# Patient Record
Sex: Female | Born: 1990 | Race: White | Hispanic: No | Marital: Single | Smoking: Never smoker
Health system: Southern US, Community
[De-identification: ages and names within clinical notes are randomized; demographics above are authoritative.]

## PROBLEM LIST (undated history)

## (undated) DIAGNOSIS — F3181 Bipolar II disorder: Secondary | ICD-10-CM

## (undated) DIAGNOSIS — F419 Anxiety disorder, unspecified: Secondary | ICD-10-CM

---
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
## 2018-12-08 ENCOUNTER — Emergency Department: Payer: BLUE CROSS/BLUE SHIELD

## 2018-12-08 ENCOUNTER — Emergency Department
Admission: EM | Admit: 2018-12-08 | Discharge: 2018-12-08 | Disposition: A | Discharge status: Home / Self Care | Payer: BLUE CROSS/BLUE SHIELD | Attending: Emergency Medicine | Admitting: Emergency Medicine

## 2018-12-08 ENCOUNTER — Other Ambulatory Visit: Payer: Self-pay

## 2018-12-08 ENCOUNTER — Encounter: Payer: Self-pay | Admitting: Emergency Medicine

## 2018-12-08 DIAGNOSIS — R109 Unspecified abdominal pain: Secondary | ICD-10-CM | POA: Diagnosis present

## 2018-12-08 DIAGNOSIS — R1011 Right upper quadrant pain: Secondary | ICD-10-CM | POA: Insufficient documentation

## 2018-12-08 DIAGNOSIS — R1013 Epigastric pain: Secondary | ICD-10-CM

## 2018-12-08 DIAGNOSIS — R110 Nausea: Secondary | ICD-10-CM | POA: Diagnosis not present

## 2018-12-08 HISTORY — DX: Anxiety disorder, unspecified: F41.9

## 2018-12-08 HISTORY — DX: Bipolar II disorder: F31.81

## 2018-12-08 LAB — COMPREHENSIVE METABOLIC PANEL
ALT: 17 U/L (ref 0–44)
AST: 15 U/L (ref 15–41)
Albumin: 3.7 g/dL (ref 3.5–5.0)
Alkaline Phosphatase: 55 U/L (ref 38–126)
Anion gap: 8 (ref 5–15)
BUN: 15 mg/dL (ref 6–20)
CO2: 27 mmol/L (ref 22–32)
Calcium: 8.9 mg/dL (ref 8.9–10.3)
Chloride: 106 mmol/L (ref 98–111)
Creatinine, Ser: 0.89 mg/dL (ref 0.44–1.00)
GFR calc Af Amer: >60 mL/min (ref >60)
GFR calc non Af Amer: >60 mL/min (ref >60)
Glucose, Bld: 107 mg/dL — ABNORMAL HIGH (ref 70–99)
Potassium: 3.9 mmol/L (ref 3.5–5.1)
Sodium: 141 mmol/L (ref 135–145)
Total Bilirubin: 0.5 mg/dL (ref 0.3–1.2)
Total Protein: 7 g/dL (ref 6.5–8.1)

## 2018-12-08 LAB — URINALYSIS, COMPLETE (UACMP) WITH MICROSCOPIC
Bacteria, UA: NONE SEEN
Bilirubin Urine: NEGATIVE
Glucose, UA: NEGATIVE mg/dL
Ketones, ur: NEGATIVE mg/dL
Nitrite: NEGATIVE
Protein, ur: NEGATIVE mg/dL
Specific Gravity, Urine: 1.029 (ref 1.005–1.030)
pH: 5 (ref 5.0–8.0)

## 2018-12-08 LAB — CBC
HCT: 45.9 % (ref 36.0–46.0)
Hemoglobin: 15.5 g/dL — ABNORMAL HIGH (ref 12.0–15.0)
MCH: 30.6 pg (ref 26.0–34.0)
MCHC: 33.8 g/dL (ref 30.0–36.0)
MCV: 90.7 fL (ref 80.0–100.0)
Platelets: 267 10*3/uL (ref 150–400)
RBC: 5.06 MIL/uL (ref 3.87–5.11)
RDW: 12.6 % (ref 11.5–15.5)
WBC: 13.3 10*3/uL — ABNORMAL HIGH (ref 4.0–10.5)
nRBC: 0 % (ref 0.0–0.2)

## 2018-12-08 LAB — LIPASE, BLOOD: Lipase: 42 U/L (ref 11–51)

## 2018-12-08 LAB — POCT PREGNANCY, URINE: Preg Test, Ur: NEGATIVE

## 2018-12-08 MED ORDER — PANTOPRAZOLE SODIUM 40 MG PO TBEC: 40.0000 mg | DELAYED_RELEASE_TABLET | Freq: Every day | ORAL | 1 refills | Status: AC

## 2018-12-08 MED ORDER — FAMOTIDINE IN NACL 20-0.9 MG/50ML-% IV SOLN
20.0000 mg | Freq: Once | INTRAVENOUS | Status: AC
  Administered 2018-12-08: 20 mg via INTRAVENOUS
  Filled 2018-12-08: qty 50

## 2018-12-08 MED ORDER — LIDOCAINE VISCOUS HCL 2 % MT SOLN
15.0000 mL | Freq: Once | OROMUCOSAL | Status: AC
  Administered 2018-12-08: 15 mL via ORAL
  Filled 2018-12-08: qty 15

## 2018-12-08 MED ORDER — ALUM & MAG HYDROXIDE-SIMETH 200-200-20 MG/5ML PO SUSP
30.0000 mL | Freq: Once | ORAL | Status: AC
  Administered 2018-12-08: 30 mL via ORAL
  Filled 2018-12-08: qty 30

## 2018-12-08 MED ORDER — METOCLOPRAMIDE HCL 5 MG/ML IJ SOLN
10.0000 mg | Freq: Once | INTRAMUSCULAR | Status: AC
  Administered 2018-12-08: 10 mg via INTRAVENOUS
  Filled 2018-12-08: qty 2

## 2018-12-08 MED ORDER — ONDANSETRON 4 MG PO TBDP: 4.0000 mg | ORAL_TABLET | Freq: Three times a day (TID) | ORAL | 0 refills | Status: AC | PRN

## 2018-12-08 NOTE — ED Notes (Signed)
Wells Guiles, primary Rn notified of pt arrival in room. Call bell at left side, pt instructed to change into gown, bed adjusted for comfort.

## 2018-12-08 NOTE — ED Triage Notes (Signed)
Pt reports abd pain, n/v/d that started yesterday.

## 2018-12-08 NOTE — ED Notes (Signed)
IV attempted x 2 by this RN. 

## 2018-12-08 NOTE — ED Notes (Signed)
Pt to xray at this time.

## 2018-12-08 NOTE — Discharge Instructions (Addendum)

## 2018-12-08 NOTE — ED Provider Notes (Signed)
[facility] Emergency Department Provider Note  ____________________________________________  Time seen: Approximately 2:18 AM  I have reviewed the triage vital signs and the nursing notes.   HISTORY  Chief Complaint Abdominal Pain   HPI Tara Holt is a 28 y.o. female with history of anxiety and bipolar disorder presents for evaluation of abdominal pain.  Patient reports the pain started 2 days ago.  She has a constant throbbing pain located in the epigastric region associated with an intermittent sharp stabbing pain.  The throbbing pain is 5 out of 10, the sharp pain is 10 out of 10.  Pain is located in her epigastric region and nonradiating.  She was unable to sleep this evening due to pain.  She has had nausea however this is chronic for her.  She reports having several months of nausea.  She reports having one episode of diarrhea last week.  No diarrhea at this time.  Has had normal bowel movements, passing flatus, no prior abdominal surgeries.  No dysuria or hematuria, patient is not sexually active, LMP a week ago, no chest pain or shortness of breath, no fever, no vomiting.   No NSAID usage, no alcohol usage, no drug use.  Past Medical History:  Diagnosis Date  . Anxiety   . Bipolar 2 disorder (HCC)     Allergies Sulfa antibiotics and Relafen [nabumetone]  History reviewed. No pertinent family history.  Social History Social History   Tobacco Use  . Smoking status: Never Smoker  . Smokeless tobacco: Never Used  Substance Use Topics  . Alcohol use: Never    Frequency: Never  . Drug use: Never    Review of Systems  Constitutional: Negative for fever. Eyes: Negative for visual changes. ENT: Negative for sore throat. Neck: No neck pain  Cardiovascular: Negative for chest pain. Respiratory: Negative for shortness of breath. Gastrointestinal: + abdominal pain and nausea. No vomiting or diarrhea. Genitourinary: Negative for dysuria.  Musculoskeletal: Negative for back pain. Skin: Negative for rash. Neurological: Negative for headaches, weakness or numbness. Psych: No SI or HI  ____________________________________________   PHYSICAL EXAM:  VITAL SIGNS: ED Triage Vitals  Enc Vitals Group     BP 12/08/18 0057 129/90     Pulse Rate 12/08/18 0057 86     Resp 12/08/18 0057 18     Temp 12/08/18 0057 98.9 F (37.2 C)     Temp Source 12/08/18 0057 Oral     SpO2 12/08/18 0057 99 %     Weight 12/08/18 0052 (!) 305 lb (138.3 kg)     Height 12/08/18 0052 5\' 3"  (1.6 m)     Head Circumference --      Peak Flow --      Pain Score 12/08/18 0051 5     Pain Loc --      Pain Edu? --      Excl. in [place]? --     Constitutional: Alert and oriented. Well appearing and in no apparent distress. HEENT:      Head: Normocephalic and atraumatic.         Eyes: Conjunctivae are normal. Sclera is non-icteric.       Mouth/Throat: Mucous membranes are moist.       Neck: Supple with no signs of meningismus. Cardiovascular: Regular rate and rhythm. No murmurs, gallops, or rubs. 2+ symmetrical distal pulses are present in all extremities. No JVD. Respiratory: Normal respiratory effort. Lungs are clear to auscultation bilaterally. No wheezes, crackles, or rhonchi.  Gastrointestinal: Soft, mild epigastric tenderness, and non distended with positive bowel sounds. No rebound or guarding. Genitourinary: No CVA tenderness. Musculoskeletal: Nontender with normal range of motion in all extremities. No edema, cyanosis, or erythema of extremities. Neurologic: Normal speech and language. Face is symmetric. Moving all extremities. No gross focal neurologic deficits are appreciated. Skin: Skin is warm, dry and intact. No rash noted. Psychiatric: Mood and affect are normal. Speech and behavior are normal.  ____________________________________________   LABS (all labs ordered are listed, but only abnormal results are displayed)  Labs Reviewed   COMPREHENSIVE METABOLIC PANEL - Abnormal; Notable for the following components:      Result Value   Glucose, Bld 107 (*)    All other components within normal limits  CBC - Abnormal; Notable for the following components:   WBC 13.3 (*)    Hemoglobin 15.5 (*)    All other components within normal limits  URINALYSIS, COMPLETE (UACMP) WITH MICROSCOPIC - Abnormal; Notable for the following components:   Color, Urine YELLOW (*)    APPearance CLOUDY (*)    Hgb urine dipstick SMALL (*)    Leukocytes,Ua SMALL (*)    All other components within normal limits  LIPASE, BLOOD  POC URINE PREG, ED  POCT PREGNANCY, URINE   ____________________________________________  EKG  none  ____________________________________________  RADIOLOGY  I have personally reviewed the images performed during this visit and I agree with the Radiologist's read.   Interpretation by Radiologist:  Dg Abd 2 Views  Result Date: 12/08/2018 CLINICAL DATA:  28 year old female with abdominal pain nausea and vomiting since yesterday. EXAM: ABDOMEN - 2 VIEW COMPARISON:  None. FINDINGS: Upright and supine views. Negative lung bases. No pneumoperitoneum. Non obstructed bowel gas pattern. Incidental IUD in the central pelvis. Abdominal and pelvic visceral contours are normal. Mild scoliosis. No acute osseous abnormality identified. IMPRESSION: Normal bowel gas pattern, no free air. Electronically Signed   By: Odessa FlemingH  Hall M.D.   On: 12/08/2018 02:27   [place] Abdomen Limited Ruq  Result Date: 12/08/2018 CLINICAL DATA:  Abdominal pain EXAM: ULTRASOUND ABDOMEN LIMITED RIGHT UPPER QUADRANT COMPARISON:  None. FINDINGS: Gallbladder: No gallstones or wall thickening visualized. No sonographic Murphy sign noted by sonographer. Common bile duct: Diameter: 2.7 mm Liver: Diffuse increased echogenicity is noted without focal mass. Portal vein is patent on color Doppler imaging with normal direction of blood flow towards the liver. Other: None.  IMPRESSION: Fatty liver. Electronically Signed   By: Alcide CleverMark  Lukens M.D.   On: 12/08/2018 02:48      ____________________________________________   PROCEDURES  Procedure(s) performed: None Procedures Critical Care performed:  None ____________________________________________   INITIAL IMPRESSION / ASSESSMENT AND PLAN / ED COURSE  28 y.o. female with history of anxiety and bipolar disorder presents for evaluation of 2 days of throbbing epigastric abdominal pain associated with several months of nausea.  Patient is well-appearing in no distress with normal vitals, abdomen is soft with epigastric tenderness, no rebound or guarding, no lower abdominal tenderness.  Labs showing leukocytosis with white count of 13.3, although some of it seems to be hemoconcentration since patient's hemoglobin is 15.5.  Normal LFTs and lipase, UA negative for UTI, pregnancy test is negative.    Ddx gastritis versus peptic ulcer disease versus pancreatitis versus gallbladder disease.  Low suspicion for appendicitis or ovarian pathology with no tenderness in the lower abdominal region.  Will give IV Reglan and Pepcid, will send patient for right upper quadrant ultrasound.  Will get a KUB  Clinical Course as of Dec 08 503  Mon Dec 08, 2018  3736 Right upper quadrant ultrasound and KUB with no significant findings.  Patient's pain resolved with Reglan, Pepcid, GI cocktail.  Possibly gastritis versus peptic ulcer disease.  Will refer to GI.  Will discharge home on Protonix and Zofran for nausea.  Discussed my standard return precautions and close follow-up with PCP.   [CV]    Clinical Course User Index [CV] Don Perking [place], MD      As part of my medical decision making, I reviewed the following data within the electronic MEDICAL RECORD NUMBER Nursing notes reviewed and incorporated, Labs reviewed , Old chart reviewed, Radiograph reviewed , Notes from prior ED visits and [place] Controlled Substance Database    Patient was evaluated in Emergency Department today for the symptoms described in the history of present illness. Patient was evaluated in the context of the global COVID-19 pandemic, which necessitated consideration that the patient might be at risk for infection with the SARS-CoV-2 virus that causes COVID-19. Institutional protocols and algorithms that pertain to the evaluation of patients at risk for COVID-19 are in a state of rapid change based on information released by regulatory bodies including the CDC and federal and state organizations. These policies and algorithms were followed during the patient's care in the ED.   ____________________________________________   FINAL CLINICAL IMPRESSION(S) / ED DIAGNOSES   Final diagnoses:  Epigastric abdominal pain  Nausea      NEW MEDICATIONS STARTED DURING THIS VISIT:  ED Discharge Orders         Ordered    pantoprazole (PROTONIX) 40 MG tablet  Daily     12/08/18 0505    ondansetron (ZOFRAN ODT) 4 MG disintegrating tablet  Every 8 hours PRN     12/08/18 0505           Note:  This document was prepared using Dragon voice recognition software and may include unintentional dictation errors.    Don Perking, [place], MD 12/08/18 [phone]

## 2019-05-03 ENCOUNTER — Emergency Department
Admission: EM | Admit: 2019-05-03 | Discharge: 2019-05-03 | Disposition: A | Discharge status: Home / Self Care | Payer: BLUE CROSS/BLUE SHIELD | Attending: Emergency Medicine | Admitting: Emergency Medicine

## 2019-05-03 ENCOUNTER — Other Ambulatory Visit: Payer: Self-pay

## 2019-05-03 ENCOUNTER — Encounter: Payer: Self-pay | Admitting: Emergency Medicine

## 2019-05-03 DIAGNOSIS — M25511 Pain in right shoulder: Secondary | ICD-10-CM | POA: Insufficient documentation

## 2019-05-03 DIAGNOSIS — Z79899 Other long term (current) drug therapy: Secondary | ICD-10-CM | POA: Diagnosis not present

## 2019-05-03 DIAGNOSIS — R55 Syncope and collapse: Secondary | ICD-10-CM | POA: Insufficient documentation

## 2019-05-03 DIAGNOSIS — R110 Nausea: Secondary | ICD-10-CM | POA: Diagnosis not present

## 2019-05-03 DIAGNOSIS — R232 Flushing: Secondary | ICD-10-CM | POA: Insufficient documentation

## 2019-05-03 LAB — COMPREHENSIVE METABOLIC PANEL
ALT: 21 U/L (ref 0–44)
AST: 16 U/L (ref 15–41)
Albumin: 3.4 g/dL — ABNORMAL LOW (ref 3.5–5.0)
Alkaline Phosphatase: 64 U/L (ref 38–126)
Anion gap: 9 (ref 5–15)
BUN: 15 mg/dL (ref 6–20)
CO2: 25 mmol/L (ref 22–32)
Calcium: 8.9 mg/dL (ref 8.9–10.3)
Chloride: 105 mmol/L (ref 98–111)
Creatinine, Ser: 0.84 mg/dL (ref 0.44–1.00)
GFR calc Af Amer: >60 mL/min (ref >60)
GFR calc non Af Amer: >60 mL/min (ref >60)
Glucose, Bld: 114 mg/dL — ABNORMAL HIGH (ref 70–99)
Potassium: 4 mmol/L (ref 3.5–5.1)
Sodium: 139 mmol/L (ref 135–145)
Total Bilirubin: 0.3 mg/dL (ref 0.3–1.2)
Total Protein: 6.6 g/dL (ref 6.5–8.1)

## 2019-05-03 LAB — CBC WITH DIFFERENTIAL/PLATELET
Abs Immature Granulocytes: 0.04 10*3/uL (ref 0.00–0.07)
Basophils Absolute: 0.1 10*3/uL (ref 0.0–0.1)
Basophils Relative: 1 %
Eosinophils Absolute: 0.2 10*3/uL (ref 0.0–0.5)
Eosinophils Relative: 2 %
HCT: 44.4 % (ref 36.0–46.0)
Hemoglobin: 14.9 g/dL (ref 12.0–15.0)
Immature Granulocytes: 0 %
Lymphocytes Relative: 31 %
Lymphs Abs: 3.7 10*3/uL (ref 0.7–4.0)
MCH: 30.1 pg (ref 26.0–34.0)
MCHC: 33.6 g/dL (ref 30.0–36.0)
MCV: 89.7 fL (ref 80.0–100.0)
Monocytes Absolute: 1 10*3/uL (ref 0.1–1.0)
Monocytes Relative: 8 %
Neutro Abs: 7 10*3/uL (ref 1.7–7.7)
Neutrophils Relative %: 58 %
Platelets: 275 10*3/uL (ref 150–400)
RBC: 4.95 MIL/uL (ref 3.87–5.11)
RDW: 12.4 % (ref 11.5–15.5)
WBC: 12 10*3/uL — ABNORMAL HIGH (ref 4.0–10.5)
nRBC: 0 % (ref 0.0–0.2)

## 2019-05-03 LAB — URINALYSIS, ROUTINE W REFLEX MICROSCOPIC
Bacteria, UA: NONE SEEN
Bilirubin Urine: NEGATIVE
Glucose, UA: NEGATIVE mg/dL
Ketones, ur: NEGATIVE mg/dL
Leukocytes,Ua: NEGATIVE
Nitrite: NEGATIVE
Protein, ur: 30 mg/dL — AB
Specific Gravity, Urine: 1.023 (ref 1.005–1.030)
pH: 5 (ref 5.0–8.0)

## 2019-05-03 LAB — LIPASE, BLOOD: Lipase: 27 U/L (ref 11–51)

## 2019-05-03 LAB — POCT PREGNANCY, URINE: Preg Test, Ur: NEGATIVE

## 2019-05-03 LAB — MAGNESIUM: Magnesium: 1.9 mg/dL (ref 1.7–2.4)

## 2019-05-03 MED ORDER — SODIUM CHLORIDE 0.9 % IV BOLUS
500.0000 mL | Freq: Once | INTRAVENOUS | Status: AC
  Administered 2019-05-03: 500 mL via INTRAVENOUS

## 2019-05-03 NOTE — Discharge Instructions (Addendum)
You have been seen today in the Emergency Department (ED)  for syncope (passing out).  Your workup including labs and EKG show reassuring results.  Your symptoms may be due to mild dehydration, so it is important that you drink plenty of non-alcoholic fluids. ° °Please call your regular doctor as soon as possible to schedule the next available clinic appointment to follow up with him/her regarding your visit to the ED and your symptoms.  Return to the Emergency Department (ED)  if you have any further syncopal episodes (pass out again) or develop ANY chest pain, pressure, tightness, trouble breathing, sudden sweating, or other symptoms that concern you. ° °

## 2019-05-03 NOTE — ED Triage Notes (Signed)
Pt arrives POV to triage with c/o syncopal episode around 30 minutes ago. Pt is in NAD.

## 2019-05-03 NOTE — ED Provider Notes (Signed)
[facility] Emergency Department Provider Note  ____________________________________________   First MD Initiated Contact with Patient 05/03/19 [phone]     (approximate)  I have reviewed the triage vital signs and the nursing notes.   HISTORY  Chief Complaint Loss of Consciousness    HPI Tara Holt is a 29 y.o. female with medical history as listed below who reports prior syncopal episodes.  She presents tonight by EMS after syncope and collapse at home.  She reports that she had gotten up and gone to the bathroom, already used the restroom, and then started to feel nauseated and flushed.  She thought she might feel better if she vomited and then the next thing she knew her father was standing over her.  He says that she collapsed and landed on her right shoulder.  She has a little bit of right shoulder pain when she moves around and has some tenderness on her right forehead where she believes that she struck something.  She has no pain in her head or her neck.  She denies chest pain, shortness of breath, cough, abdominal pain, and dysuria.  She does not have any nausea at this time.   Onset was acute and severe, nothing in particular made her better or worse.  She was able to stand and get up off of the EMS stretcher and transferred to the ED exam bed without difficulty.        Past Medical History:  Diagnosis Date  . Anxiety   . Bipolar 2 disorder (HCC)     There are no problems to display for this patient.   History reviewed. No pertinent surgical history.  Prior to Admission medications   Medication Sig Start Date End Date Taking? Authorizing Provider  ondansetron (ZOFRAN ODT) 4 MG disintegrating tablet Take 1 tablet (4 mg total) by mouth every 8 (eight) hours as needed. 12/08/18   Nita Sickle, MD  pantoprazole (PROTONIX) 40 MG tablet Take 1 tablet (40 mg total) by mouth daily. 12/08/18 12/08/19  Nita Sickle, MD     Allergies Sulfa antibiotics and Relafen [nabumetone]  No family history on file.  Social History Social History   Tobacco Use  . Smoking status: Never Smoker  . Smokeless tobacco: Never Used  Substance Use Topics  . Alcohol use: Never  . Drug use: Never    Review of Systems Constitutional: No fever/chills Eyes: No visual changes. ENT: No sore throat. Cardiovascular: Syncope and collapse.  Denies chest pain. Respiratory: Denies shortness of breath. Gastrointestinal: No abdominal pain.  No nausea, no vomiting.  No diarrhea.  No constipation. Genitourinary: Negative for dysuria. Musculoskeletal: Some mild pain in the right shoulder, mild contusion to right side of forehead.  No neck or back pain. Integumentary: Negative for rash. Neurological: Negative for headaches, focal weakness or numbness.   ____________________________________________   PHYSICAL EXAM:  VITAL SIGNS: ED Triage Vitals [05/03/19 0237]  Enc Vitals Group     BP      Pulse      Resp      Temp      Temp src      SpO2      Weight (!) 140.6 kg (310 lb)     Height 1.6 m (5\' 3" )     Head Circumference      Peak Flow      Pain Score 2     Pain Loc      Pain Edu?      Excl.  in [place]?     Constitutional: Alert and oriented.  Generally well-appearing and in no distress. Eyes: Conjunctivae are normal.  Head: Atraumatic. Nose: No congestion/rhinnorhea. Mouth/Throat: Patient is wearing a mask.  I remove the mask and see a little bit of dried blood on her lips but I see no tongue nor lip lacerations. Neck: No stridor.  No meningeal signs.   Cardiovascular: Normal rate, regular rhythm. Good peripheral circulation. Grossly normal heart sounds. Respiratory: Normal respiratory effort.  No retractions. Gastrointestinal: Soft and nontender. No distention.  Musculoskeletal: No cervical spine tenderness to palpation or with flexion/extension or rotation side to side.  The patient has full range of motion of  both arms and was able to remove her own sweatshirt without any difficulty in spite of some mild pain in her right shoulder after the fall.  Mild contusion to right forehead. Neurologic:  Normal speech and language. No gross focal neurologic deficits are appreciated.  Skin:  Skin is warm, dry and intact. Psychiatric: Mood and affect are normal. Speech and behavior are normal.  ____________________________________________   LABS (all labs ordered are listed, but only abnormal results are displayed)  Labs Reviewed  CBC WITH DIFFERENTIAL/PLATELET - Abnormal; Notable for the following components:      Result Value   WBC 12.0 (*)    All other components within normal limits  URINALYSIS, ROUTINE W REFLEX MICROSCOPIC - Abnormal; Notable for the following components:   Color, Urine YELLOW (*)    APPearance HAZY (*)    Hgb urine dipstick SMALL (*)    Protein, ur 30 (*)    All other components within normal limits  COMPREHENSIVE METABOLIC PANEL - Abnormal; Notable for the following components:   Glucose, Bld 114 (*)    Albumin 3.4 (*)    All other components within normal limits  URINE CULTURE  MAGNESIUM  LIPASE, BLOOD  POC URINE PREG, ED  POCT PREGNANCY, URINE   ____________________________________________  EKG  ED ECG REPORT I, Hinda Kehr, the attending physician, personally viewed and interpreted this ECG.  Date: 05/03/2019 EKG Time: 2:40 AM Rate: 85 Rhythm: normal sinus rhythm QRS Axis: normal Intervals: normal ST/T Wave abnormalities: normal Narrative Interpretation: no evidence of acute ischemia  ____________________________________________  RADIOLOGY I, Hinda Kehr, personally viewed and evaluated these images (plain radiographs) as part of my medical decision making, as well as reviewing the written report by the radiologist.  ED MD interpretation: No indication for emergent imaging  Official radiology report(s): No results  found.  ____________________________________________   PROCEDURES   Procedure(s) performed (including Critical Care):  Procedures   ____________________________________________   INITIAL IMPRESSION / MDM / [facility] / ED COURSE  As part of my medical decision making, I reviewed the following data within the [facility] notes reviewed and incorporated, Labs reviewed , EKG interpreted , Old chart reviewed, Notes from prior ED visits and [place] Controlled Substance Database   Differential diagnosis includes, but is not limited to, vasovagal episode, cardiogenic syncope such as from a brief arrhythmia, PE, acute infection/sepsis, volume depletion/orthostasis, electrolyte or metabolic abnormality.  I am giving the patient 500 mL of fluids and checking basic labs.  EKG is reassuring.  She is asymptomatic at this time.  I suspect a vasovagal episode.  Her blood pressure was borderline and we will recheck after the fluids and give her a trial of ambulation.  At this time I do not suspect an emergent cause such as cardiac arrhythmia.  She  is currently on the cardiac monitor.  She is PERC negative.       Clinical Course as of May 02 412  Wynelle Link May 03, 2019  4431 The patient states that she feels "1000 times better" and wants to go home.  She is ambulatory without difficulty.  I will discharge her for close outpatient follow-up and I gave my usual customary return precautions   [CF]    Clinical Course User Index [CF] Loleta Rose, MD     ____________________________________________  FINAL CLINICAL IMPRESSION(S) / ED DIAGNOSES  Final diagnoses:  Syncope and collapse     MEDICATIONS GIVEN DURING THIS VISIT:  Medications  sodium chloride 0.9 % bolus 500 mL (500 mLs Intravenous New Bag/Given 05/03/19 0247)     ED Discharge Orders    None      *Please note:  Tara Holt was evaluated in Emergency Department on 05/03/2019 for the symptoms  described in the history of present illness. She was evaluated in the context of the global COVID-19 pandemic, which necessitated consideration that the patient might be at risk for infection with the SARS-CoV-2 virus that causes COVID-19. Institutional protocols and algorithms that pertain to the evaluation of patients at risk for COVID-19 are in a state of rapid change based on information released by regulatory bodies including the CDC and federal and state organizations. These policies and algorithms were followed during the patient's care in the ED.  Some ED evaluations and interventions may be delayed as a result of limited staffing during the pandemic.*  Note:  This document was prepared using Dragon voice recognition software and may include unintentional dictation errors.   Loleta Rose, MD 05/03/19 [phone]

## 2019-05-04 LAB — URINE CULTURE
Culture: <10000 — AB
Special Requests: NORMAL

## 2021-04-13 IMAGING — CR DG ABDOMEN 2V
1 series · 4 of 4 positions shown · non-contrast
Comparison: None.

CLINICAL DATA: 28-year-old female with abdominal pain nausea and
vomiting since yesterday.

EXAM:
ABDOMEN - 2 VIEW

[Series 1: dg abd 2 views · 0.14mm/px · 4 of 4 slices shown]
[im 1/4]
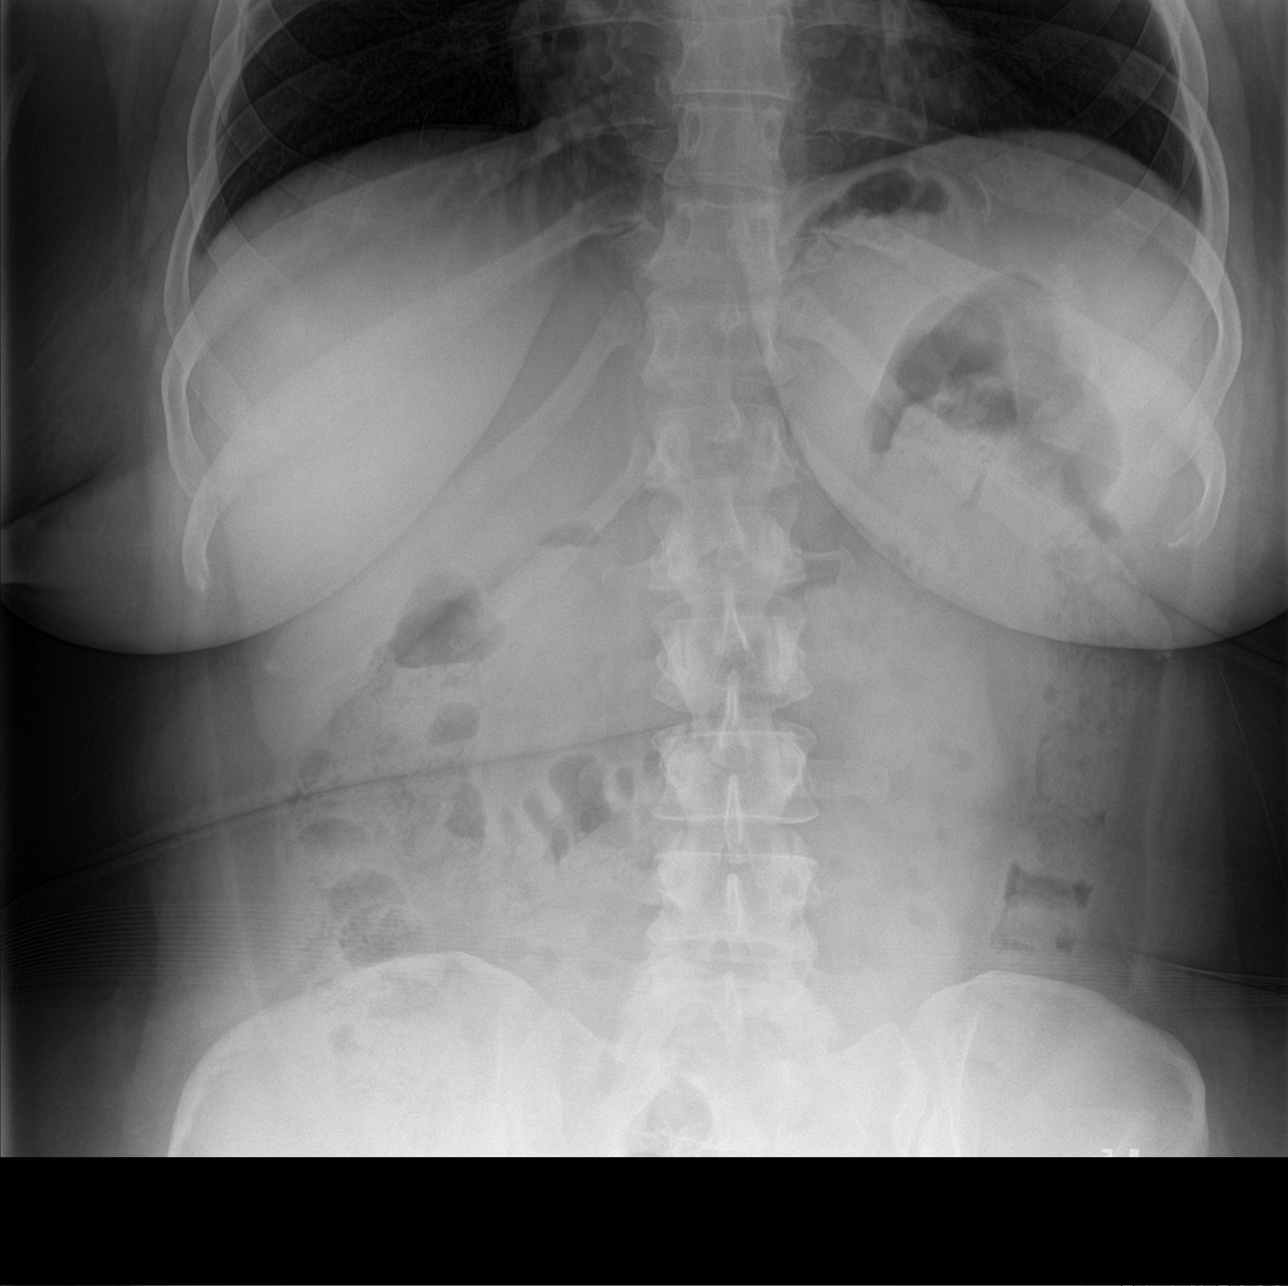
[im 2/4]
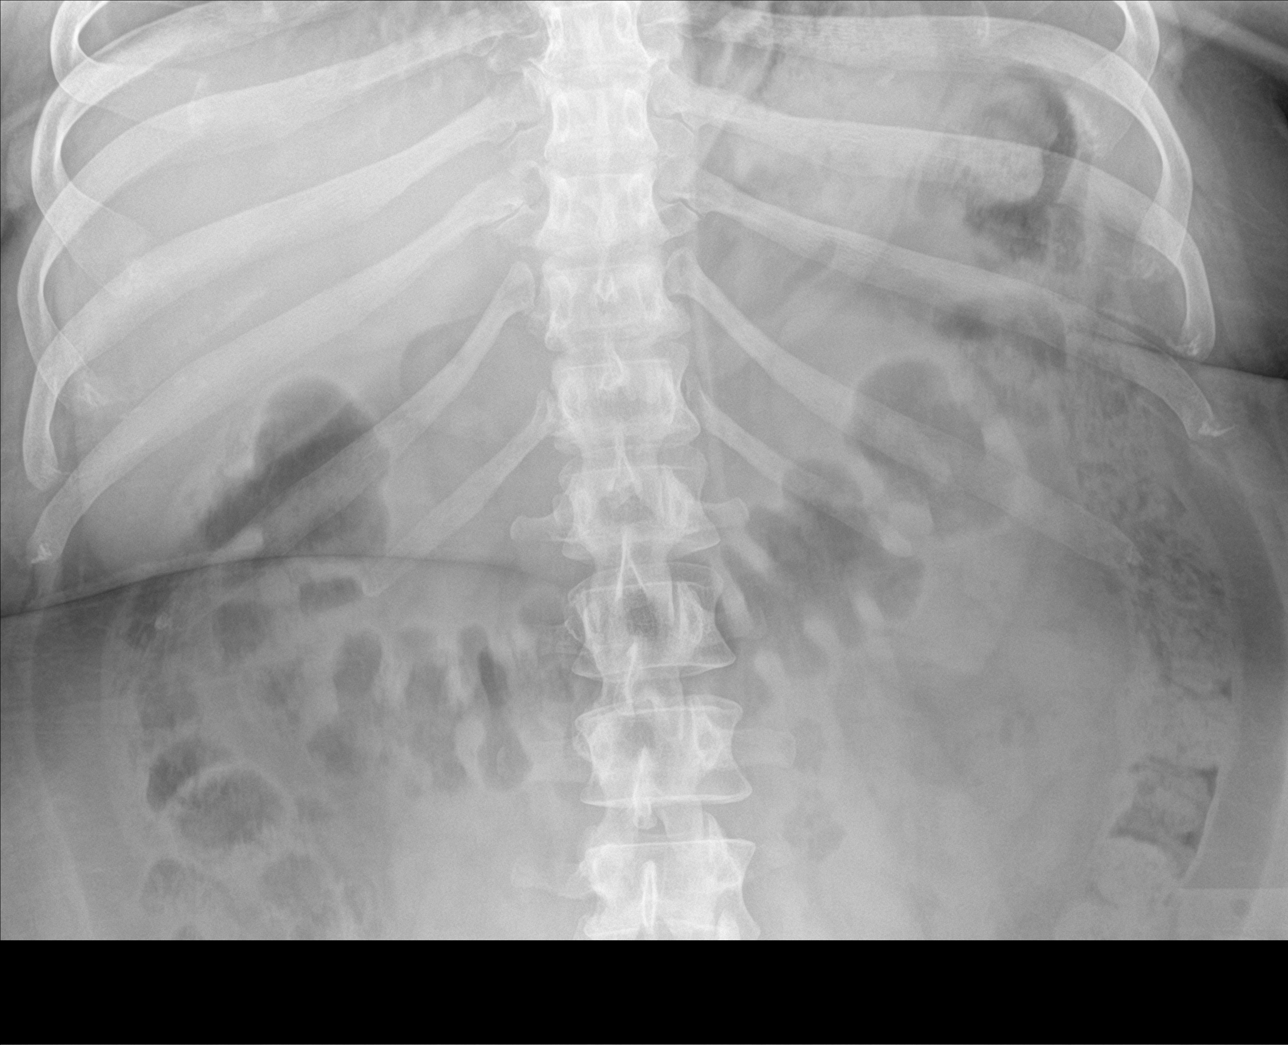
[im 3/4]
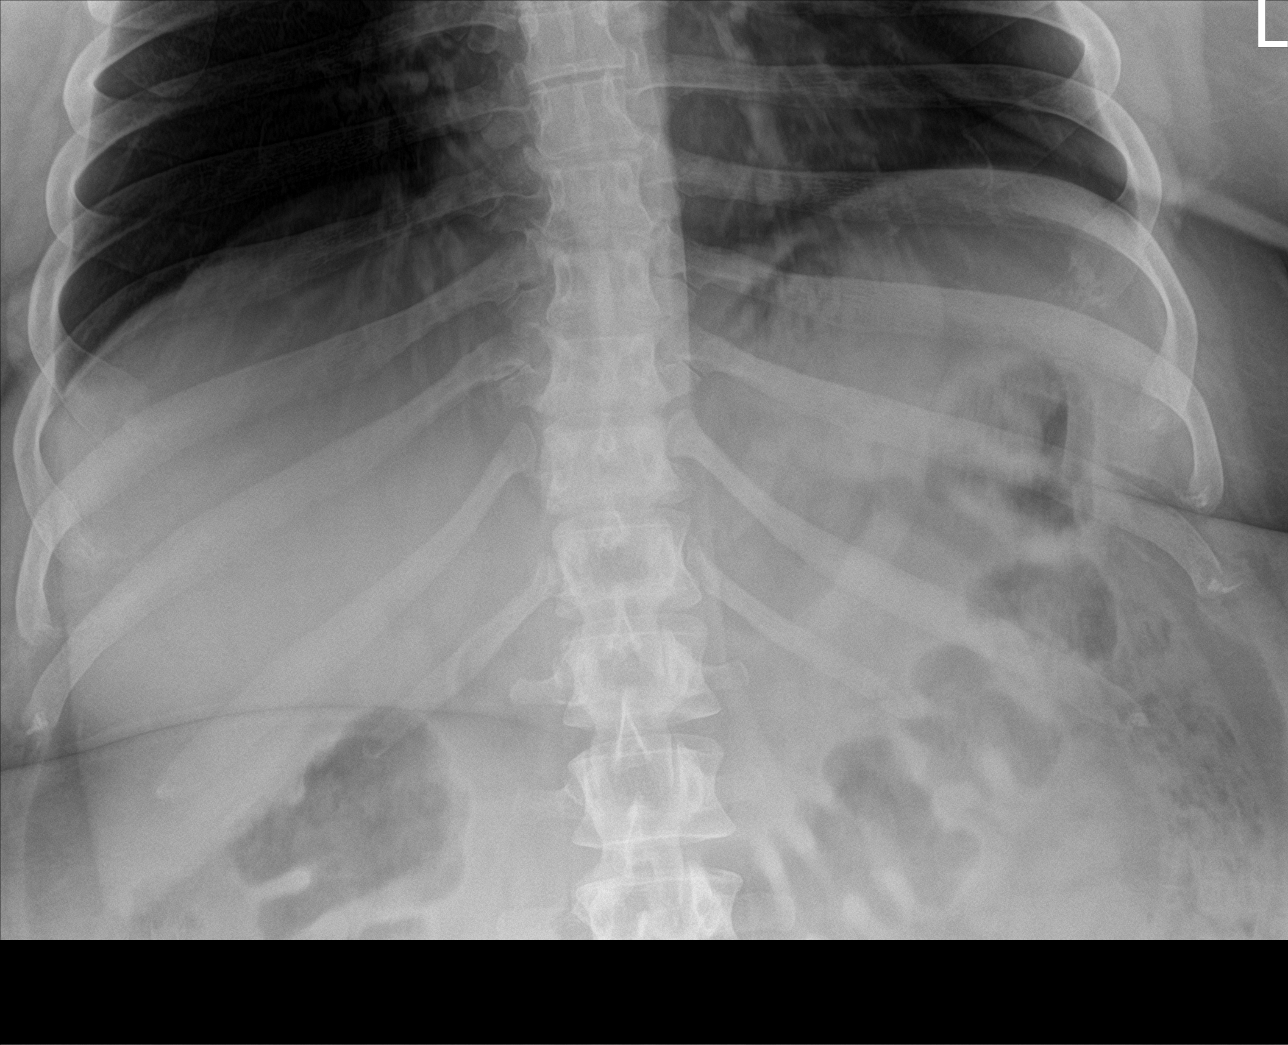
[im 4/4]
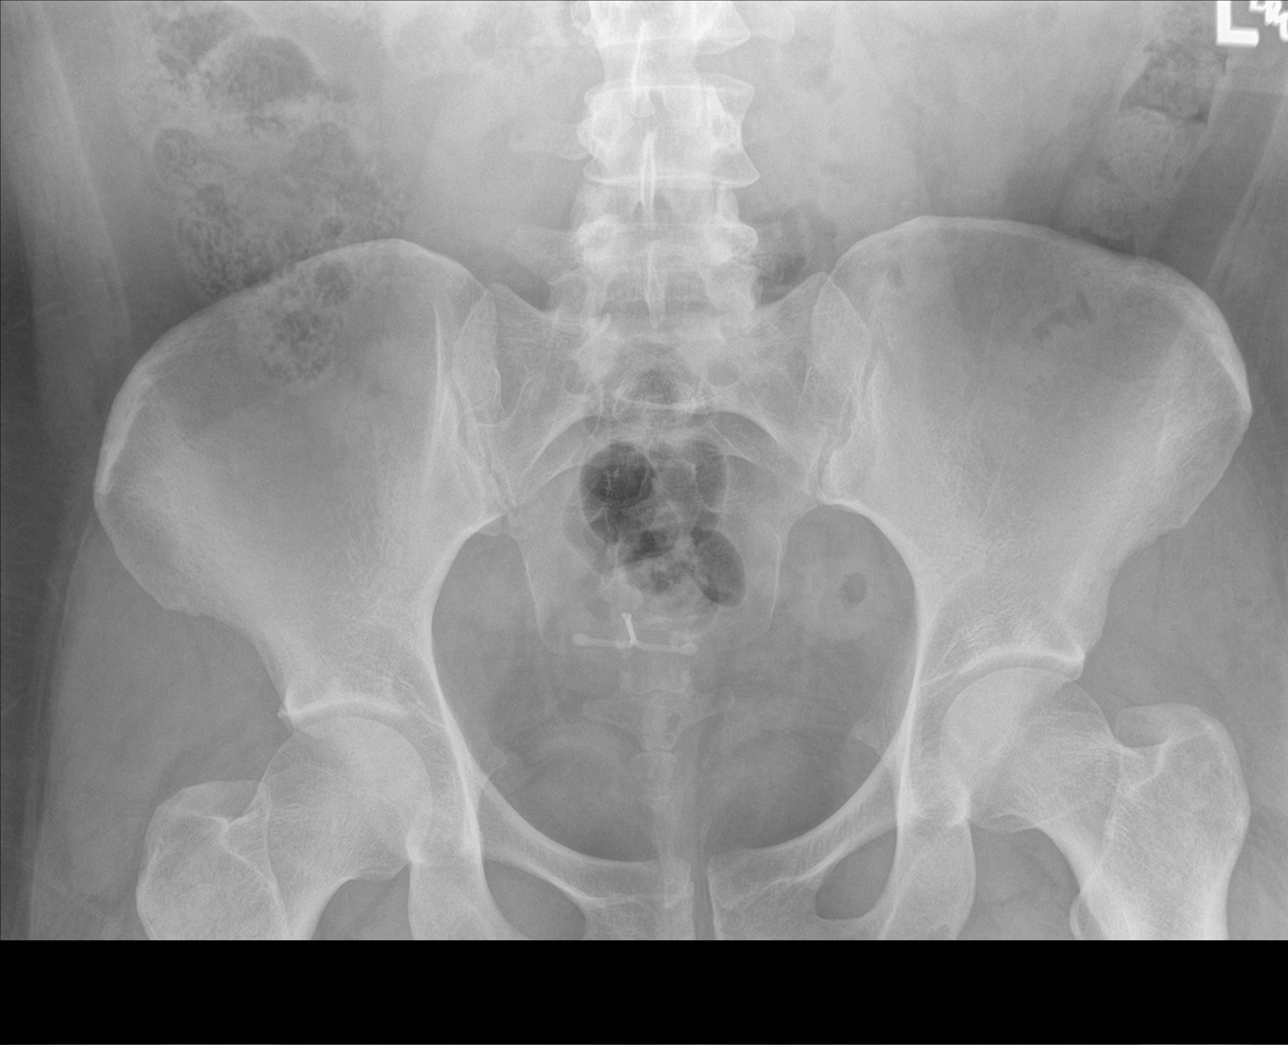

[4 of 4 positions shown; findings below may reference images not displayed]

FINDINGS: Upright and supine views. Negative lung bases. No pneumoperitoneum.
Non obstructed bowel gas pattern. Incidental IUD in the central
pelvis. Abdominal and pelvic visceral contours are normal. Mild
scoliosis. No acute osseous abnormality identified.
IMPRESSION: Normal bowel gas pattern, no free air.

## 2021-04-13 IMAGING — US US ABDOMEN LIMITED
1 series · 14 of 25 positions shown · non-contrast
Comparison: None.

CLINICAL DATA: Abdominal pain

EXAM:
ULTRASOUND ABDOMEN LIMITED RIGHT UPPER QUADRANT

[Series 1: us abdomen limited · 14 of 48 slices shown]
[im 1/48]
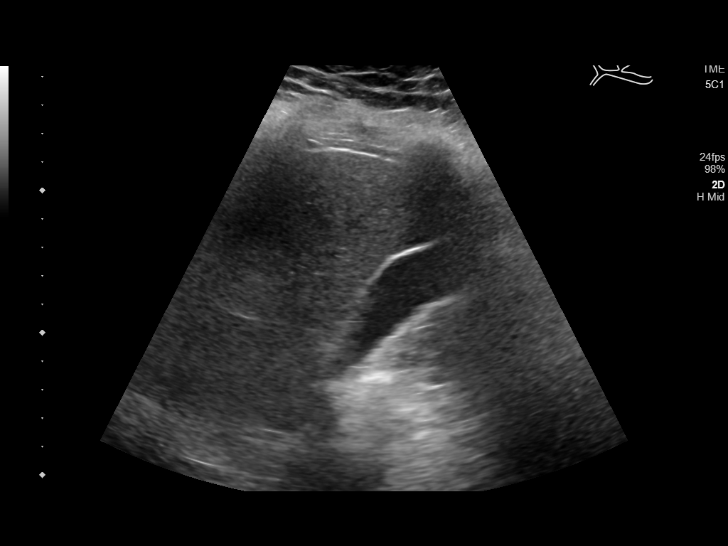
[im 4/48]
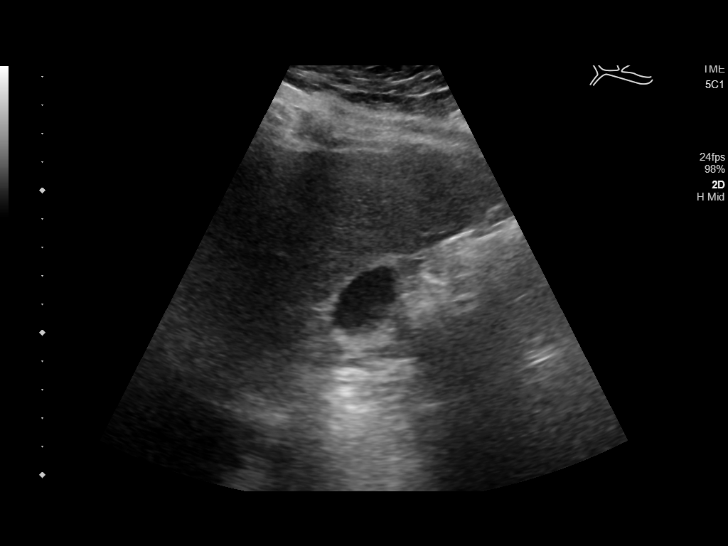
[im 8/48]
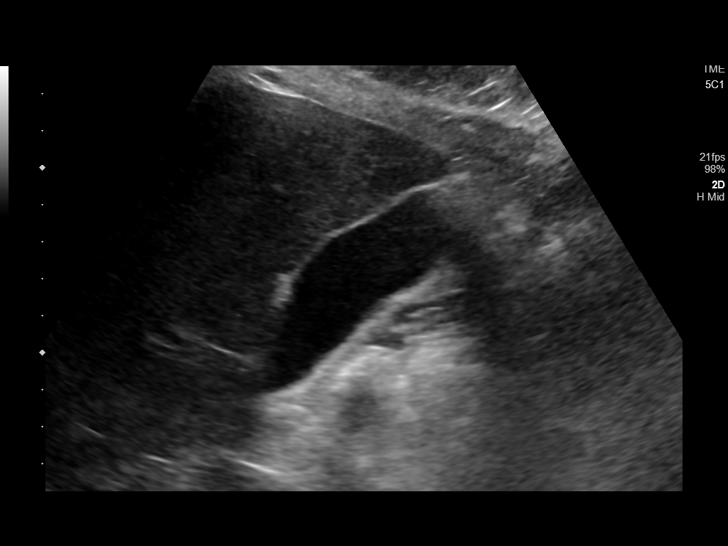
[im 12/48]
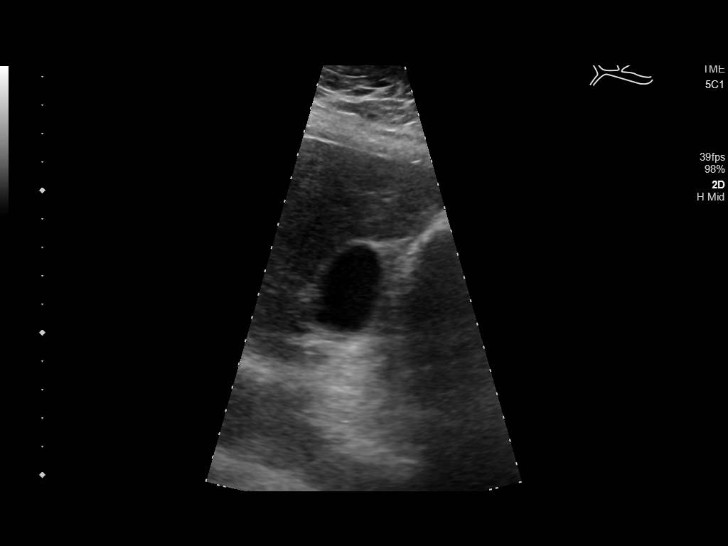
[im 16/48]
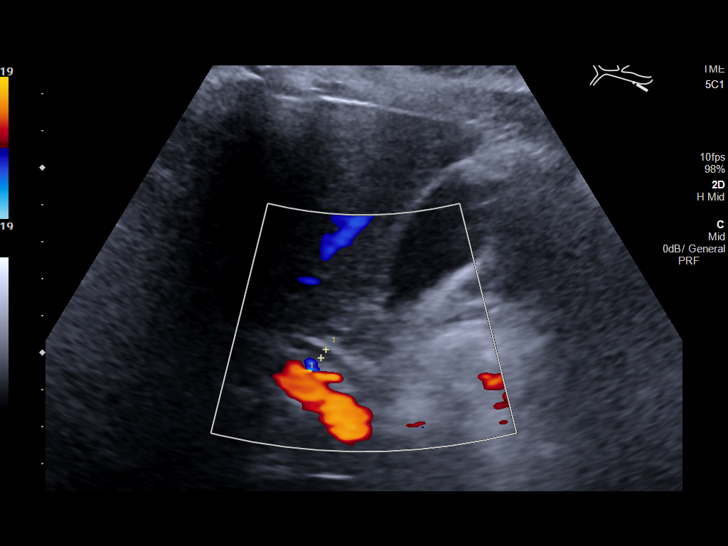
[im 18/48]
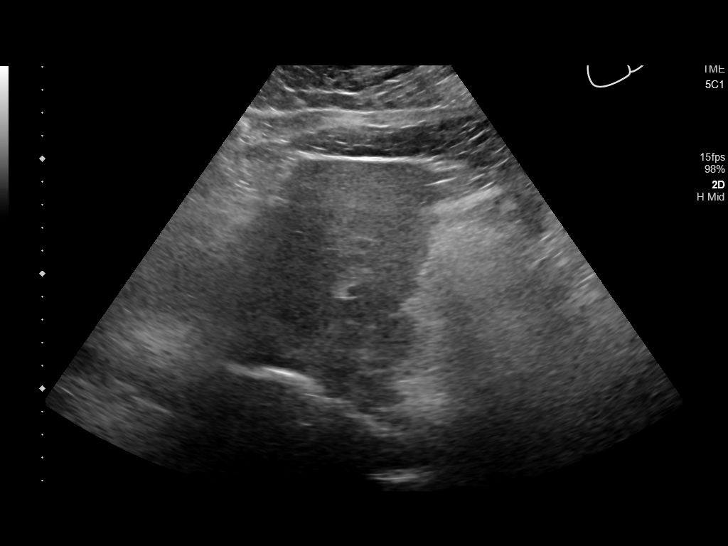
[im 22/48]
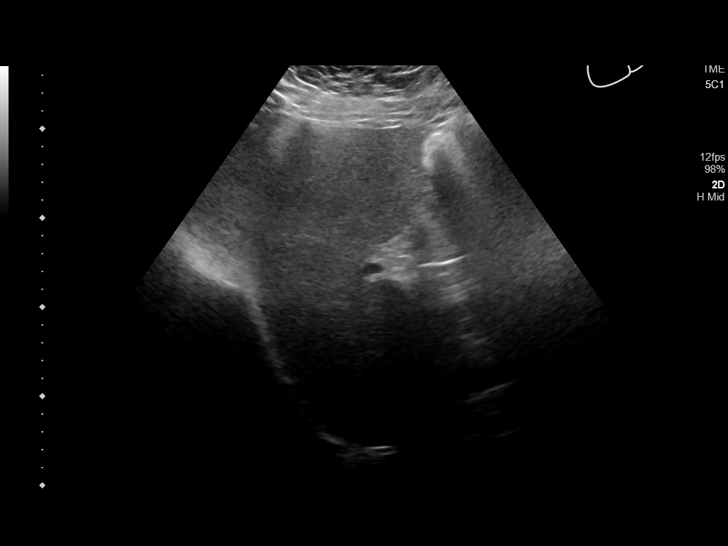
[im 26/48]
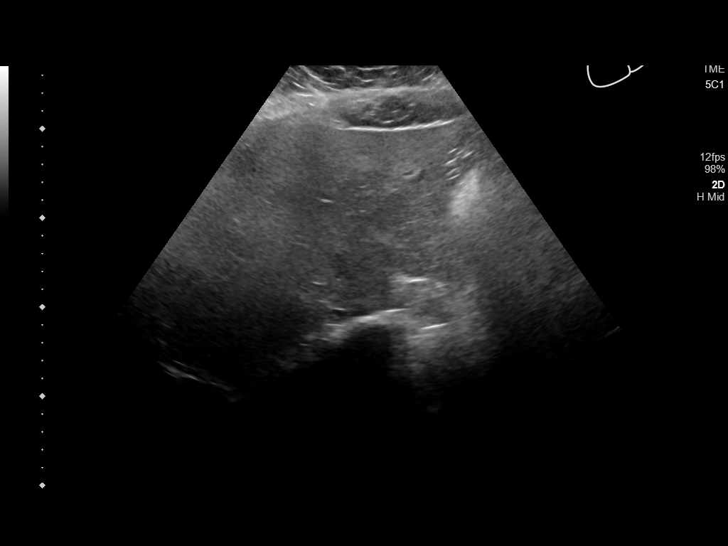
[im 30/48]
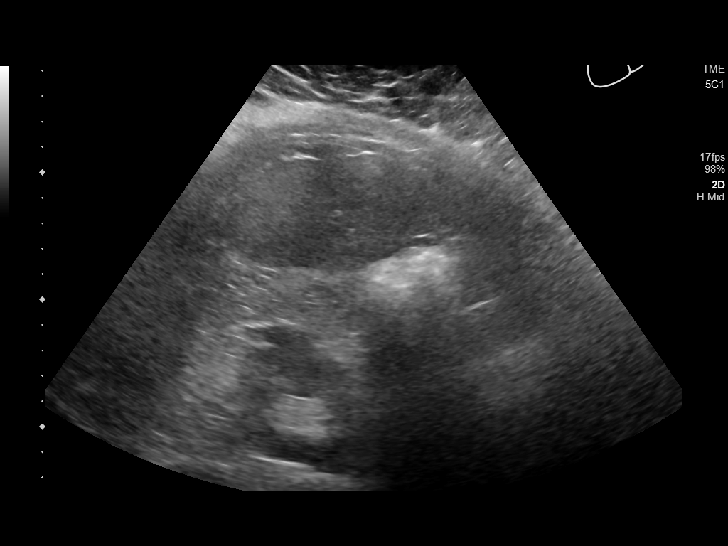
[im 32/48]
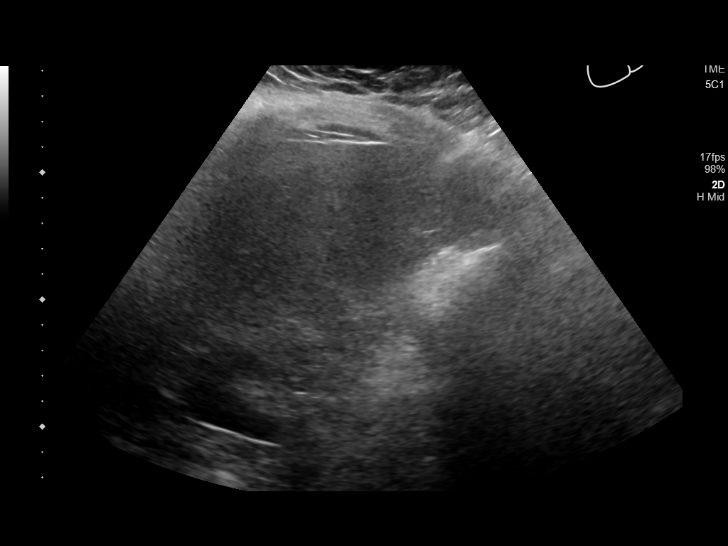
[im 36/48]
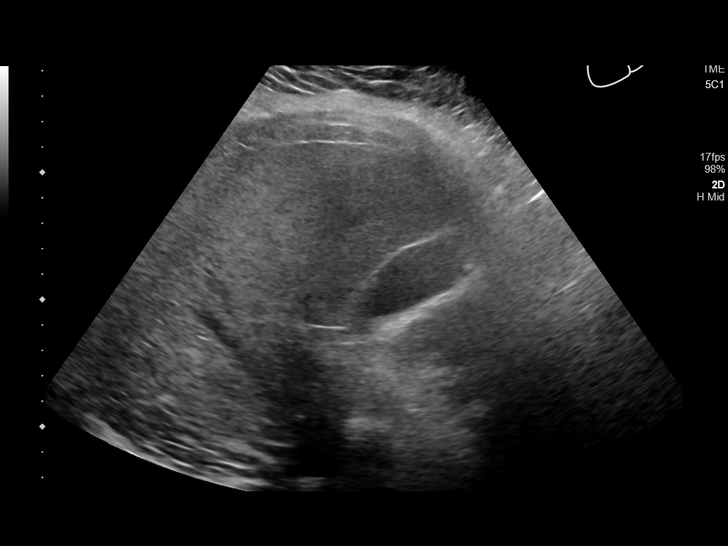
[im 40/48]
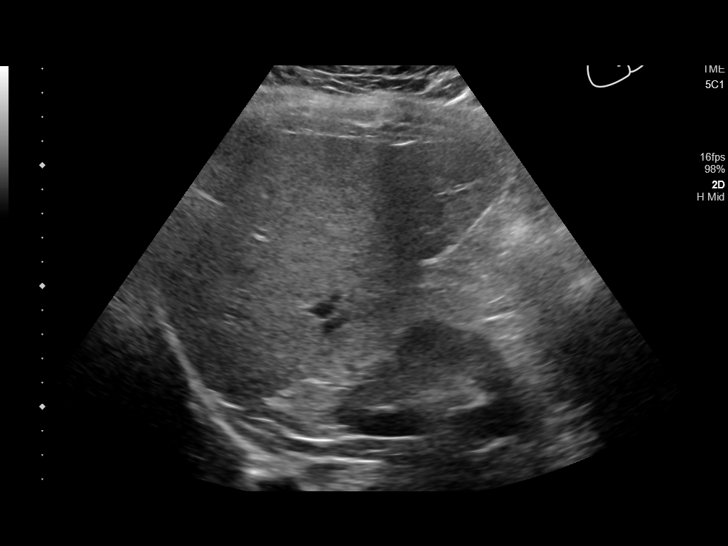
[im 44/48]
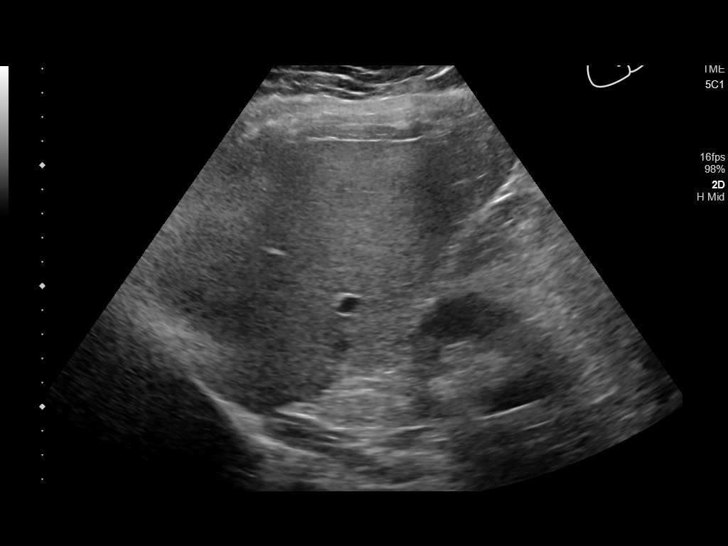
[im 48/48]
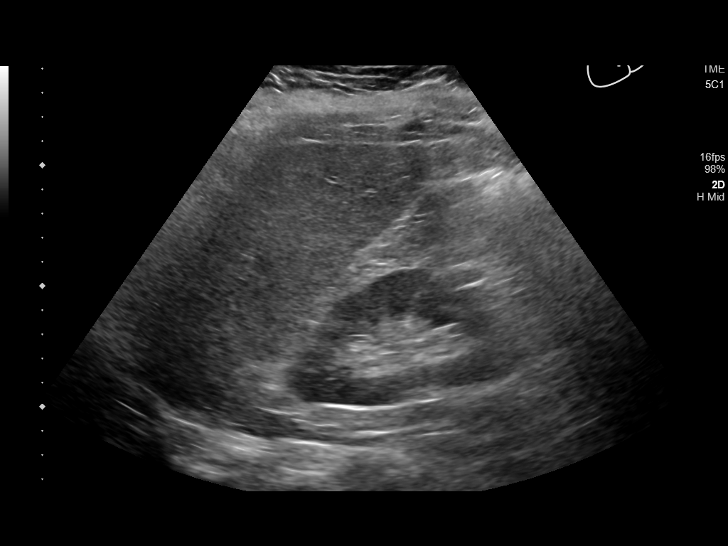

[14 of 25 positions shown; findings below may reference images not displayed]

FINDINGS: Gallbladder:

No gallstones or wall thickening visualized. No sonographic Murphy
sign noted by sonographer.

Common bile duct:

Diameter: 2.7 mm

Liver:

Diffuse increased echogenicity is noted without focal mass. Portal
vein is patent on color Doppler imaging with normal direction of
blood flow towards the liver.

Other: None.
IMPRESSION: Fatty liver.

## 2021-04-28 DIAGNOSIS — M79605 Pain in left leg: Secondary | ICD-10-CM | POA: Diagnosis present

## 2021-04-28 DIAGNOSIS — Z5321 Procedure and treatment not carried out due to patient leaving prior to being seen by health care provider: Secondary | ICD-10-CM | POA: Insufficient documentation

## 2021-04-28 NOTE — ED Triage Notes (Addendum)
Presents to the ED tonight with a pulsing type pain in the L lateral side of leg. Intermittent. Wax and wain's with intensity. Denies CP and SOB. Also, vaginal bleeding x 1 month. A&Ox4. Skin p/w/d. RR even and nonlabored. No redness or swelling. Has had two neg home pregnancy tests. IUD, Recent travel to [place].

## 2021-04-29 ENCOUNTER — Emergency Department: Payer: BC Managed Care – PPO

## 2021-04-29 ENCOUNTER — Other Ambulatory Visit: Payer: Self-pay

## 2021-04-29 ENCOUNTER — Emergency Department
Admission: EM | Admit: 2021-04-29 | Discharge: 2021-04-29 | Disposition: A | Discharge status: Home / Self Care | Payer: BC Managed Care – PPO | Attending: Emergency Medicine | Admitting: Emergency Medicine

## 2021-04-29 LAB — CBC WITH DIFFERENTIAL/PLATELET
Abs Immature Granulocytes: 0.05 10*3/uL (ref 0.00–0.07)
Basophils Absolute: 0.1 10*3/uL (ref 0.0–0.1)
Basophils Relative: 0 %
Eosinophils Absolute: 0.2 10*3/uL (ref 0.0–0.5)
Eosinophils Relative: 2 %
HCT: 43.8 % (ref 36.0–46.0)
Hemoglobin: 14.2 g/dL (ref 12.0–15.0)
Immature Granulocytes: 0 %
Lymphocytes Relative: 26 %
Lymphs Abs: 3.4 10*3/uL (ref 0.7–4.0)
MCH: 30 pg (ref 26.0–34.0)
MCHC: 32.4 g/dL (ref 30.0–36.0)
MCV: 92.4 fL (ref 80.0–100.0)
Monocytes Absolute: 0.9 10*3/uL (ref 0.1–1.0)
Monocytes Relative: 7 %
Neutro Abs: 8.4 10*3/uL — ABNORMAL HIGH (ref 1.7–7.7)
Neutrophils Relative %: 65 %
Platelets: 264 10*3/uL (ref 150–400)
RBC: 4.74 MIL/uL (ref 3.87–5.11)
RDW: 12.6 % (ref 11.5–15.5)
WBC: 13 10*3/uL — ABNORMAL HIGH (ref 4.0–10.5)
nRBC: 0 % (ref 0.0–0.2)

## 2021-04-29 LAB — COMPREHENSIVE METABOLIC PANEL
ALT: 19 U/L (ref 0–44)
AST: 19 U/L (ref 15–41)
Albumin: 3.3 g/dL — ABNORMAL LOW (ref 3.5–5.0)
Alkaline Phosphatase: 65 U/L (ref 38–126)
Anion gap: 4 — ABNORMAL LOW (ref 5–15)
BUN: 20 mg/dL (ref 6–20)
CO2: 23 mmol/L (ref 22–32)
Calcium: 8.3 mg/dL — ABNORMAL LOW (ref 8.9–10.3)
Chloride: 109 mmol/L (ref 98–111)
Creatinine, Ser: 0.73 mg/dL (ref 0.44–1.00)
GFR, Estimated: >60 mL/min (ref >60)
Glucose, Bld: 92 mg/dL (ref 70–99)
Potassium: 4 mmol/L (ref 3.5–5.1)
Sodium: 136 mmol/L (ref 135–145)
Total Bilirubin: 0.4 mg/dL (ref 0.3–1.2)
Total Protein: 6.7 g/dL (ref 6.5–8.1)

## 2021-04-29 LAB — D-DIMER, QUANTITATIVE: D-Dimer, Quant: 0.3 ug/mL-FEU (ref 0.00–0.50)

## 2021-04-29 NOTE — ED Notes (Signed)
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 Pt ambulatory from department in no acute distress.
